# Patient Record
Sex: Male | Born: 1965 | Race: White | Hispanic: No | Marital: Single | Smoking: Current every day smoker
Health system: Southern US, Community
[De-identification: ages and names within clinical notes are randomized; demographics above are authoritative.]

## PROBLEM LIST (undated history)

## (undated) DIAGNOSIS — I10 Essential (primary) hypertension: Secondary | ICD-10-CM

---

## 2017-12-11 ENCOUNTER — Emergency Department (HOSPITAL_COMMUNITY): Payer: Self-pay

## 2017-12-11 ENCOUNTER — Emergency Department (HOSPITAL_COMMUNITY)
Admission: EM | Admit: 2017-12-11 | Discharge: 2017-12-11 | Disposition: A | Discharge status: Home / Self Care | Payer: Self-pay | Attending: Emergency Medicine | Admitting: Emergency Medicine

## 2017-12-11 ENCOUNTER — Other Ambulatory Visit: Payer: Self-pay

## 2017-12-11 ENCOUNTER — Encounter (HOSPITAL_COMMUNITY): Payer: Self-pay | Admitting: *Deleted

## 2017-12-11 DIAGNOSIS — I10 Essential (primary) hypertension: Secondary | ICD-10-CM | POA: Insufficient documentation

## 2017-12-11 DIAGNOSIS — J0110 Acute frontal sinusitis, unspecified: Secondary | ICD-10-CM | POA: Insufficient documentation

## 2017-12-11 DIAGNOSIS — R51 Headache: Secondary | ICD-10-CM | POA: Insufficient documentation

## 2017-12-11 DIAGNOSIS — R519 Headache, unspecified: Secondary | ICD-10-CM

## 2017-12-11 DIAGNOSIS — F17210 Nicotine dependence, cigarettes, uncomplicated: Secondary | ICD-10-CM | POA: Insufficient documentation

## 2017-12-11 HISTORY — DX: Essential (primary) hypertension: I10

## 2017-12-11 LAB — INFLUENZA PANEL BY PCR (TYPE A & B)
INFLBPCR: NEGATIVE
Influenza A By PCR: NEGATIVE

## 2017-12-11 MED ORDER — IBUPROFEN 400 MG PO TABS
400.0000 mg | ORAL_TABLET | Freq: Once | ORAL | Status: AC | PRN
  Administered 2017-12-11: 400 mg via ORAL
  Filled 2017-12-11: qty 1

## 2017-12-11 MED ORDER — AMOXICILLIN-POT CLAVULANATE 875-125 MG PO TABS: 1.0000 | ORAL_TABLET | Freq: Two times a day (BID) | ORAL | 0 refills | Status: AC

## 2017-12-11 MED ORDER — AMLODIPINE BESYLATE 5 MG PO TABS
5.0000 mg | ORAL_TABLET | Freq: Once | ORAL | Status: AC
  Administered 2017-12-11: 5 mg via ORAL
  Filled 2017-12-11: qty 1

## 2017-12-11 MED ORDER — AMOXICILLIN-POT CLAVULANATE 875-125 MG PO TABS
1.0000 | ORAL_TABLET | Freq: Once | ORAL | Status: AC
  Administered 2017-12-11: 1 via ORAL
  Filled 2017-12-11: qty 1

## 2017-12-11 MED ORDER — AMLODIPINE BESYLATE 5 MG PO TABS: 5.0000 mg | ORAL_TABLET | Freq: Every day | ORAL | 0 refills | Status: AC

## 2017-12-11 NOTE — ED Notes (Signed)
Patient transported to CT 

## 2017-12-11 NOTE — ED Triage Notes (Signed)
Pt arrived by [facility] for a headache x 2 days and flu like symptoms. Pt is  Hypertensive at triage. Hx of same but does not take meds.

## 2017-12-11 NOTE — ED Provider Notes (Signed)
[facility]   CSN: [id number] Arrival date & time: 12/11/17  1014     History   Chief Complaint Chief Complaint  Patient presents with  . Headache    HPI Jonathon Rose is a 52 y.o. male.   Headache   This is a new problem. The current episode started more than 2 days ago. The problem occurs constantly. The problem has not changed since onset.The headache is associated with nothing. The quality of the pain is described as sharp and dull. The pain is moderate. He has tried nothing for the symptoms.    Past Medical History:  Diagnosis Date  . Hypertension     There are no active problems to display for this patient.   History reviewed. No pertinent surgical history.      Home Medications    Prior to Admission medications   Medication Sig Start Date End Date Taking? Authorizing Provider  amLODipine (NORVASC) 5 MG tablet Take 1 tablet (5 mg total) by mouth daily. 12/11/17   Nakeita Styles, Barbara CowerJason, MD  amoxicillin-clavulanate (AUGMENTIN) 875-125 MG tablet Take 1 tablet by mouth 2 (two) times daily for 10 days. One po bid x 7 days 12/11/17 12/21/17  Shardee Dieu, Barbara CowerJason, MD    Family History History reviewed. No pertinent family history.  Social History Social History   Tobacco Use  . Smoking status: Current Every Day Smoker  Substance Use Topics  . Alcohol use: Not on file  . Drug use: Not on file     Allergies   Patient has no known allergies.   Review of Systems Review of Systems  Constitutional: Positive for chills and fatigue.  Respiratory: Positive for cough.   Neurological: Positive for headaches.  All other systems reviewed and are negative.    Physical Exam Updated Vital Signs BP (!) 144/96   Pulse 66   Temp 97.6 F (36.4 C) (Oral)   Resp 17   Ht 5\' 10"  (1.778 m)   Wt 72.6 kg (160 lb)   SpO2 98%   BMI 22.96 kg/m   Physical Exam  Constitutional: He is oriented to person, place, and time. He  appears well-developed and well-nourished.  HENT:  Head: Normocephalic and atraumatic.  Eyes: Pupils are equal, round, and reactive to light. EOM are normal.  Neck: Normal range of motion.  Cardiovascular: Normal rate.  Pulmonary/Chest: Effort normal. No respiratory distress.  Abdominal: Soft. He exhibits no distension.  Musculoskeletal: Normal range of motion.  Neurological: He is alert and oriented to person, place, and time. He has normal strength. He is not disoriented. No cranial nerve deficit or sensory deficit. Coordination and gait normal.  Skin: Skin is warm and dry.  Nursing note and vitals reviewed.    ED Treatments / Results  Labs (all labs ordered are listed, but only abnormal results are displayed) Labs Reviewed  INFLUENZA PANEL BY PCR (TYPE A & B)    EKG None  Radiology Ct Head Wo Contrast  Result Date: 12/11/2017 CLINICAL DATA:  Throbbing headache 2 days. EXAM: CT HEAD WITHOUT CONTRAST TECHNIQUE: Contiguous axial images were obtained from the base of the skull through the vertex without intravenous contrast. COMPARISON:  None. FINDINGS: Brain: No evidence of acute infarction, hemorrhage, hydrocephalus, extra-axial collection or mass lesion/mass effect. Vascular: No hyperdense vessel or unexpected calcification. Skull: Normal. Negative for fracture or focal lesion. Sinuses/Orbits: Orbits are normal symmetric. There is an air-fluid level over the right frontal sinus with significant opacification over the  right frontal and right ethmoid sinus as well as the maxillary sinuses bilaterally. Mastoid air cells are clear. Other: None. IMPRESSION: Normal CT of the brain. Significant opacification of the sinuses as described with air-fluid level over the right frontal sinus as findings are likely due to acute sinusitis. Electronically Signed   By: Elberta Fortis M.D.   On: 12/11/2017 15:16    Procedures Procedures (including critical care time)  Medications Ordered in  ED Medications  ibuprofen (ADVIL,MOTRIN) tablet 400 mg (400 mg Oral Given 12/11/17 1035)  amLODipine (NORVASC) tablet 5 mg (5 mg Oral Given 12/11/17 1535)  amoxicillin-clavulanate (AUGMENTIN) 875-125 MG per tablet 1 tablet (1 tablet Oral Given 12/11/17 1601)     Initial Impression / Assessment and Plan / ED Course  I have reviewed the triage vital signs and the nursing notes.  Pertinent labs & imaging results that were available during my care of the patient were reviewed by me and considered in my medical decision making (see chart for details).     Will obtain imaging as this is a headache and like he has had before.  Low suspicion for head bleed or meningitis at this time.  Also could be just sinusitis or influenza will evaluate for same we will give a headache cocktail.  Related to his blood pressure as it was quite elevated when he got here we will likely start him on some blood pressure medication and discharge home with PCP follow-up.  Significant improvement here.  Likely sinusitis on CT scan we will treat for same.  Influenza got lost however we have an alternative cause for her symptoms so we will not repeat.  Patient will obtain PCP follow-up for blood pressure management.  Final Clinical Impressions(s) / ED Diagnoses   Final diagnoses:  Nonintractable headache, unspecified chronicity pattern, unspecified headache type  Acute frontal sinusitis, recurrence not specified    ED Discharge Orders        Ordered    amoxicillin-clavulanate (AUGMENTIN) 875-125 MG tablet  2 times daily     12/11/17 1554    amLODipine (NORVASC) 5 MG tablet  Daily     12/11/17 1554       Brandace Cargle, Barbara Cower, MD 12/11/17 1607

## 2019-08-10 IMAGING — CT CT HEAD W/O CM
4 series · 16 of 47 positions shown, 18 images · non-contrast
Comparison: None.

CLINICAL DATA: Throbbing headache 2 days.

EXAM:
CT HEAD WITHOUT CONTRAST
TECHNIQUE: Contiguous axial images were obtained from the base of the skull
through the vertex without intravenous contrast.

[Series 3: head without · axial · non-contrast · 0.46mm/px · z∈[-87,+33]mm · 7 of 33 slices shown, 9 images]
[im 5/33  brain]
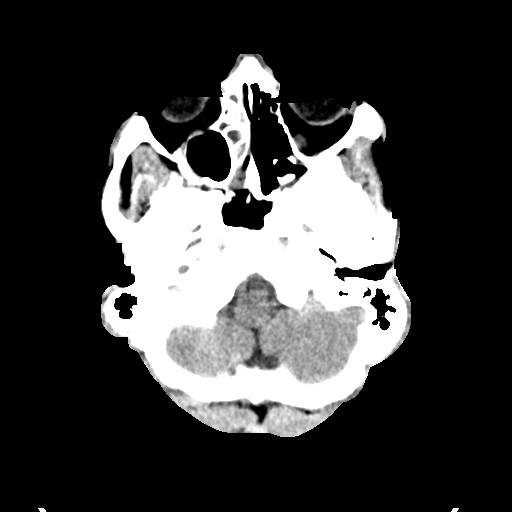
[im 5/33  bone]
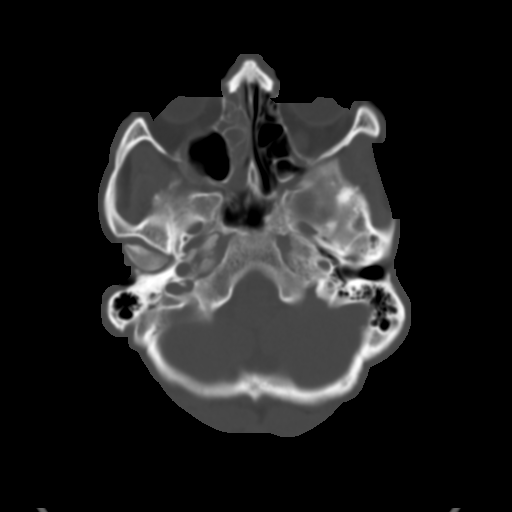
[im 9/33  brain]
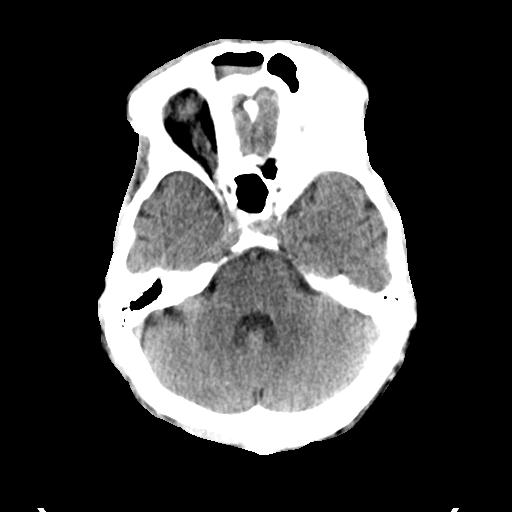
[im 13/33  brain]
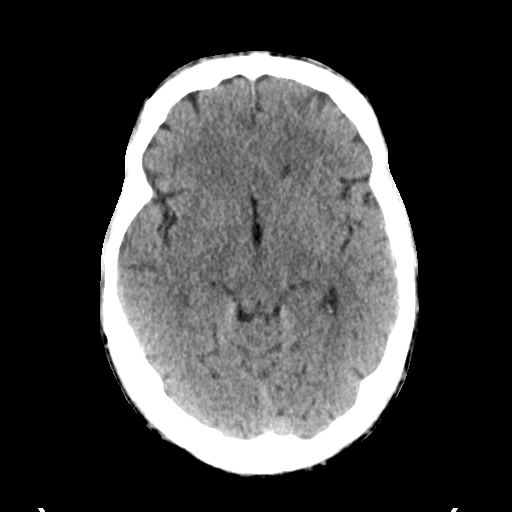
[im 17/33  brain]
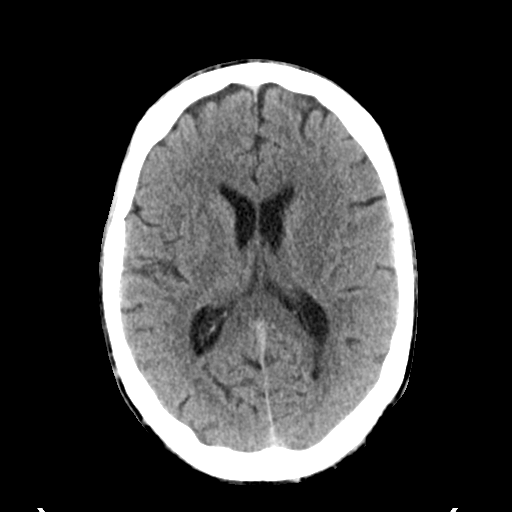
[im 21/33  brain]
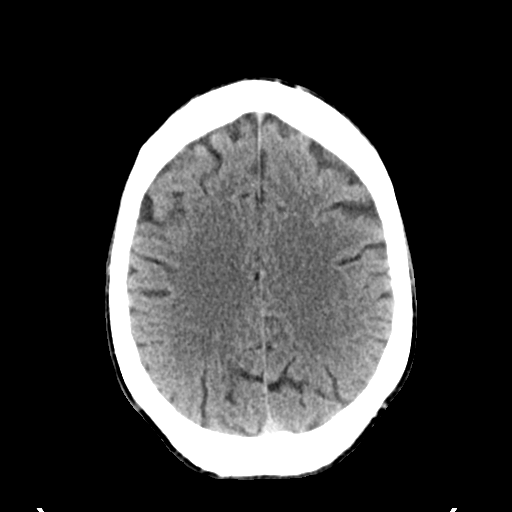
[im 21/33  bone]
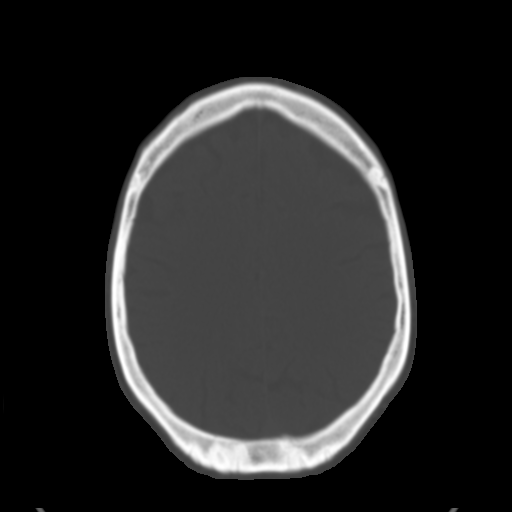
[im 25/33  brain]
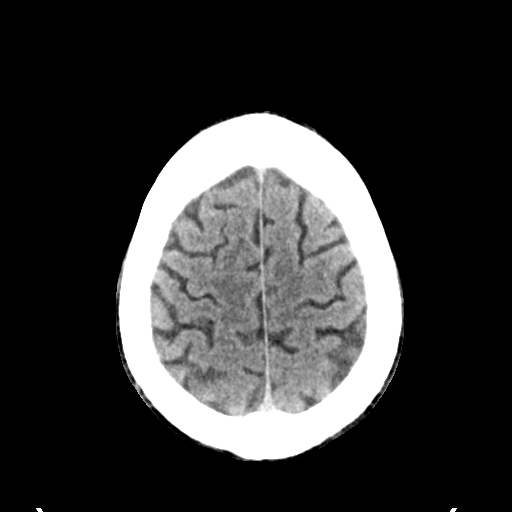
[im 29/33  brain]
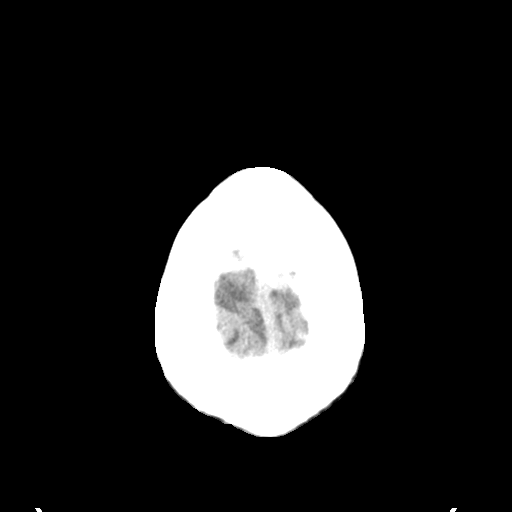

[Series 4: head bone · axial · 0.46mm/px · z∈[-91,-59]mm · 3 of 83 slices shown]
[im 9/83  bone]
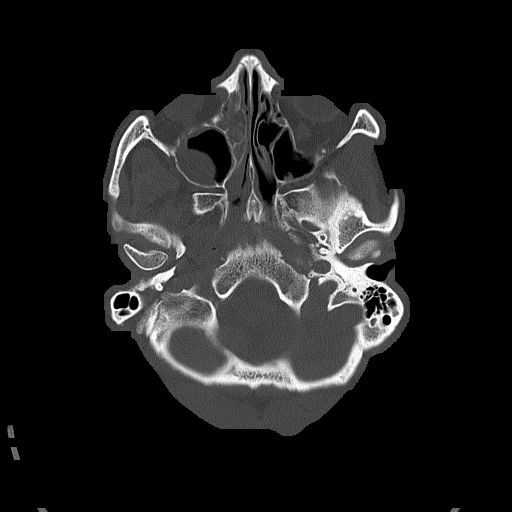
[im 17/83  bone]
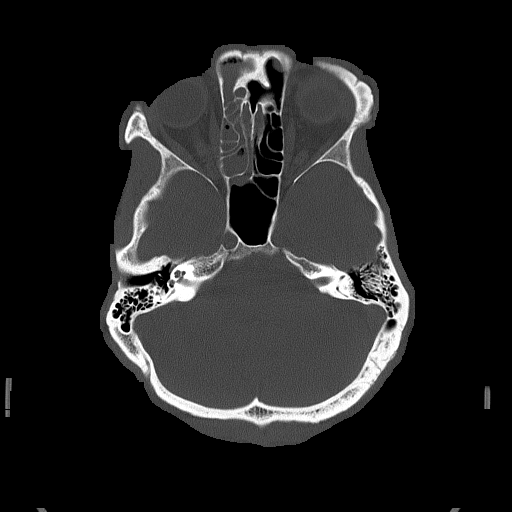
[im 25/83  bone]
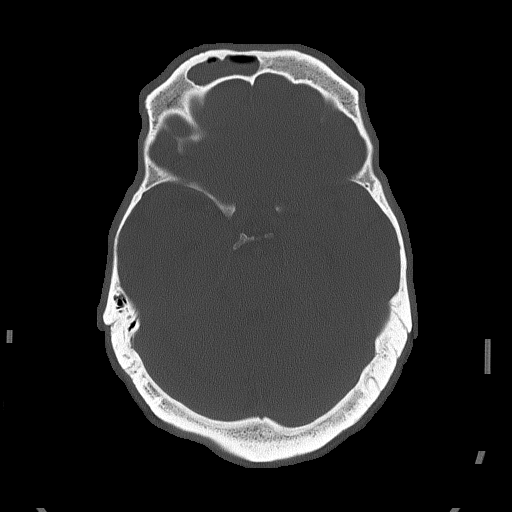

[Series 5: head without cor · coronal · non-contrast · 0.33mm/px · 3 of 72 slices shown]
[im 24/72  brain]
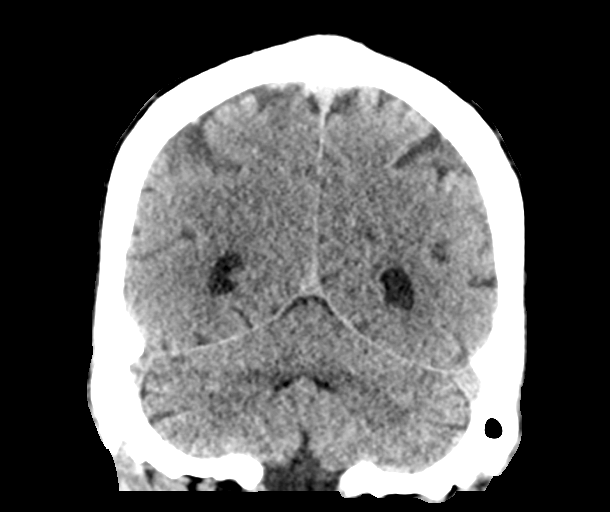
[im 32/72  brain]
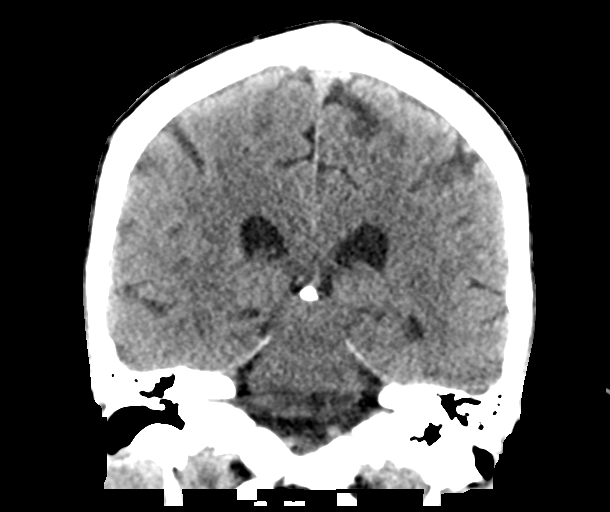
[im 40/72  brain]
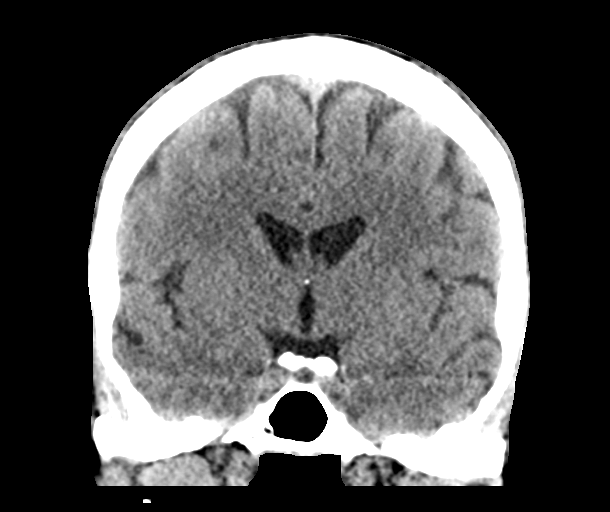

[Series 6: head without sag · sagittal · non-contrast · 0.33mm/px · 3 of 52 slices shown]
[im 18/52  brain]
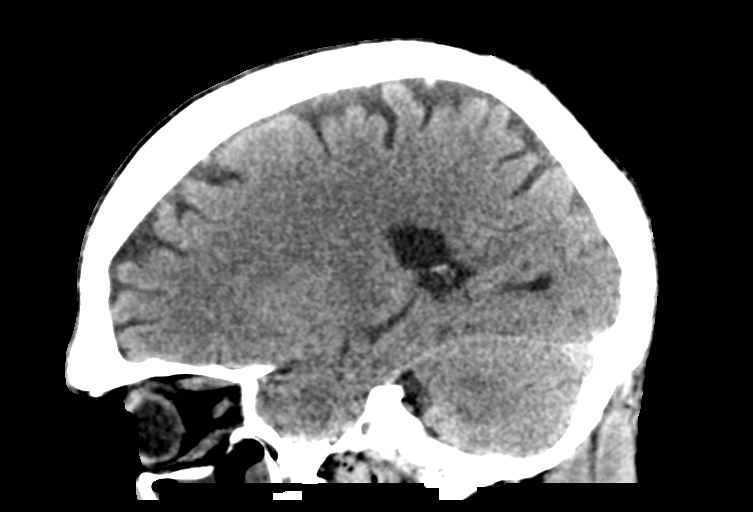
[im 26/52  brain]
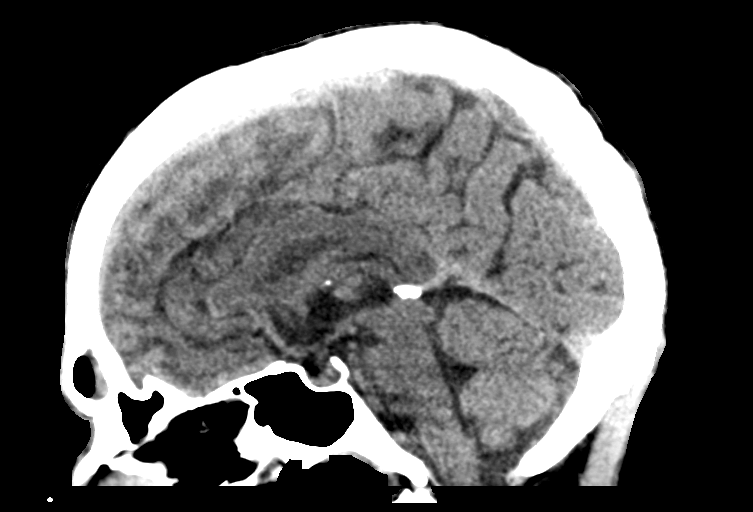
[im 35/52  brain]
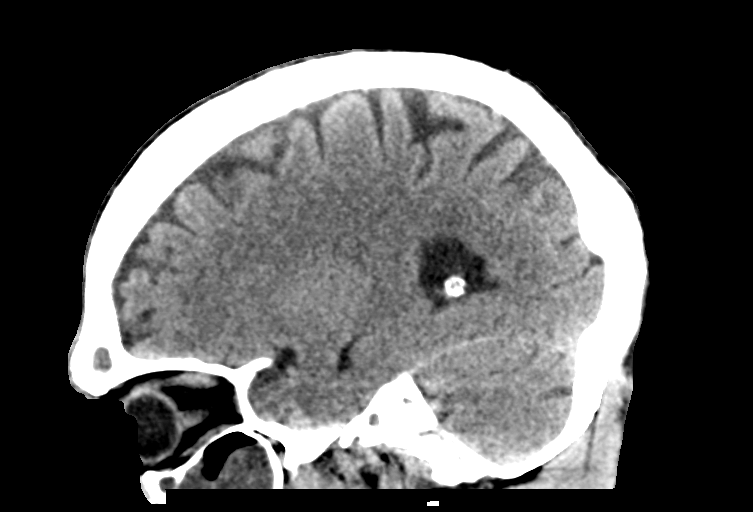

[16 of 47 positions shown; findings below may reference images not displayed]

FINDINGS: Brain: No evidence of acute infarction, hemorrhage, hydrocephalus,
extra-axial collection or mass lesion/mass effect.

Vascular: No hyperdense vessel or unexpected calcification.

Skull: Normal. Negative for fracture or focal lesion.

Sinuses/Orbits: Orbits are normal symmetric. There is an air-fluid
level over the right frontal sinus with significant opacification
over the right frontal and right ethmoid sinus as well as the
maxillary sinuses bilaterally. Mastoid air cells are clear.

Other: None.
IMPRESSION: Normal CT of the brain.

Significant opacification of the sinuses as described with air-fluid
level over the right frontal sinus as findings are likely due to
acute sinusitis.
